# Patient Record
Sex: Male | Born: 2005
Health system: Southern US, Community
[De-identification: ages and names within clinical notes are randomized; demographics above are authoritative.]

## PROBLEM LIST (undated history)

## (undated) DIAGNOSIS — S22019A Unspecified fracture of first thoracic vertebra, initial encounter for closed fracture: Secondary | ICD-10-CM

## (undated) DIAGNOSIS — J301 Allergic rhinitis due to pollen: Secondary | ICD-10-CM

## (undated) HISTORY — DX: Allergic rhinitis due to pollen: J30.1

## (undated) HISTORY — PX: ADENOIDECTOMY: SUR15

## (undated) HISTORY — DX: Unspecified fracture of first thoracic vertebra, initial encounter for closed fracture: S22.019A

---
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
## 2023-02-06 NOTE — Progress Notes (Unsigned)
Derelle Cockrell T. Lucien Budney, MD, CAQ Sports Medicine [facility] at [facility] [address] [place], [address]  Phone: [phone]  FAX: [phone]  Tank Sayani - 17 y.o. male  MRN [id number]  Date of Birth: 08/28/2005  Date: 02/08/2023  PCP: Hannah Beat, MD  Referral: Hannah Beat, MD  No chief complaint on file.  Patient Care Team: Hannah Beat, MD as PCP - General (Family Medicine) Subjective:   Quido Bruington is a 17 y.o. pleasant patient who presents with the following:  Preventative Health Maintenance Visit:  Health Maintenance Summary Reviewed and updated, unless pt declines services.  Tobacco History Reviewed. Alcohol: No concerns, no excessive use Exercise Habits: Some activity, rec at least 30 mins 5 times a week STD concerns: no risk or activity to increase risk Drug Use: None  Gardasil booster Covid vaccine series HIV Hep C  Health Maintenance  Topic Date Due   COVID-19 Vaccine (1) Never done   HPV VACCINES (2 - Male 2-dose series) 04/11/2017   HIV Screening  Never done   Hepatitis C Screening  Never done   INFLUENZA VACCINE  09/28/2022   DTaP/Tdap/Td (7 - Td or Tdap) 08/10/2025   Immunization History  Administered Date(s) Administered   DTaP / IPV 03/16/2008   Dtap, Unspecified 05/06/2004, 07/15/2004, 09/23/2004, 06/09/2005   H1N1 03/16/2008   HIB, Unspecified 05/06/2004, 07/15/2004, 03/03/2005   HPV 9-valent 10/09/2016   Hep A, Unspecified 06/09/2005, 03/01/2006   Hep B, Unspecified 2003-07-02, 03/29/2004, 11/11/2004   Influenza, Seasonal, Injecte, Preservative Fre 01/17/2009, 03/19/2009   Influenza,inj,Quad PF,6+ Mos 01/27/2013, 12/07/2015   Influenza-Unspecified 03/03/2005, 03/16/2008, 01/27/2013   MMR 03/16/2008   MMRV 03/03/2005   Meningococcal Conjugate 10/09/2016   Meningococcal Mcv4o 11/14/2021   Pneumococcal Conjugate-13 05/06/2004, 07/15/2004, 09/23/2004, 03/03/2005   Polio, Unspecified  05/06/2004, 07/15/2004, 06/09/2005   Tdap 08/11/2015   Varicella 03/16/2008   Patient Active Problem List   Diagnosis Date Noted   On Accutane therapy 04/25/2022   Displaced fracture of shaft of right clavicle, initial encounter for closed fracture 11/15/2020   Fracture of proximal phalanx of finger 07/22/2014    Past Medical History:  Diagnosis Date   Allergic rhinitis due to pollen    Closed T1 fracture (HCC) 07/2015   T1 spinous process fracture at summer camp    Past Surgical History:  Procedure Laterality Date   ADENOIDECTOMY      Family History  Problem Relation Age of Onset   Diabetes Mother    Diabetes Paternal Grandfather    COPD Paternal Grandfather        es-smoker    Social History   Social History Narrative   Lives with Mom and Dad   His Dad had TBI, now helps with [employer]   Nephew of [place] and Nataniel Pessolano   Plays almost every sport. Baseball, Basketball, Football, Hotel manager    Past Medical History, Surgical History, Social History, Family History, Problem List, Medications, and Allergies have been reviewed and updated if relevant.  Review of Systems: Pertinent positives are listed above.  Otherwise, a full 14 point review of systems has been done in full and it is negative except where it is noted positive.  Objective:   There were no vitals taken for this visit. Ideal Body Weight:    Ideal Body Weight:   No results found.    09/01/2022   10:53 AM 06/05/2022   12:12 PM  Depression screen PHQ 2/9  Decreased Interest 0 0  Down, Depressed, Hopeless 0 0  PHQ - 2 Score 0 0  Altered sleeping 0   Tired, decreased energy 0   Change in appetite 0   Feeling bad or failure about yourself  0   Trouble concentrating 0   Moving slowly or fidgety/restless 0   Suicidal thoughts 0   PHQ-9 Score 0   Difficult doing work/chores Not difficult at all      GEN: well developed, well nourished, no acute distress Eyes: conjunctiva and lids  normal, PERRLA, EOMI ENT: TM clear, nares clear, oral exam WNL Neck: supple, no lymphadenopathy, no thyromegaly, no JVD Pulm: clear to auscultation and percussion, respiratory effort normal CV: regular rate and rhythm, S1-S2, no murmur, rub or gallop, no bruits, peripheral pulses normal and symmetric, no cyanosis, clubbing, edema or varicosities GI: soft, non-tender; no hepatosplenomegaly, masses; active bowel sounds all quadrants GU: deferred Lymph: no cervical, axillary or inguinal adenopathy MSK: gait normal, muscle tone and strength WNL, no joint swelling, effusions, discoloration, crepitus  SKIN: clear, good turgor, color WNL, no rashes, lesions, or ulcerations Neuro: normal mental status, normal strength, sensation, and motion Psych: alert; oriented to person, place and time, normally interactive and not anxious or depressed in appearance.  All labs reviewed with patient. Results for orders placed or performed in visit on 12/01/22  Alpha-Gal Panel  Result Value Ref Range   Beef <0.10 kU/L   CLASS 0    Allergen, Mutton, f88 <0.10 kU/L   Class 0    Allergen, Pork, f26 0.15 (H) kU/L   CLASS 0/1    GALACTOSE-ALPHA-1,3-GALACTOSE IGE* <0.10 <0.10 kU/L  Interpretation:  Result Value Ref Range   Interpretation      Assessment and Plan:     ICD-10-CM   1. Healthcare maintenance  Z00.00       Health Maintenance Exam: The patient's preventative maintenance and recommended screening tests for an annual wellness exam were reviewed in full today. Brought up to date unless services declined.  Counselled on the importance of diet, exercise, and its role in overall health and mortality. The patient's FH and SH was reviewed, including their home life, tobacco status, and drug and alcohol status.  Follow-up in 1 year for physical exam or additional follow-up below.  Disposition: No follow-ups on file.  No orders of the defined types were placed in this encounter.  There are no  discontinued medications. No orders of the defined types were placed in this encounter.   Signed,  Elpidio Galea. Jahnae Mcadoo, MD   Allergies as of 02/08/2023       Reactions   Amoxicillin Rash        Medication List        Accurate as of February 06, 2023  6:19 PM. If you have any questions, ask your nurse or doctor.          acetaminophen 500 MG tablet Commonly known as: TYLENOL Take 250 mg by mouth every 6 (six) hours as needed.   Claravis 40 MG capsule Generic drug: ISOtretinoin Take 40 mg by mouth 2 (two) times daily.   clindamycin 300 MG capsule Commonly known as: Cleocin Take 1 capsule (300 mg total) by mouth 3 (three) times daily.   diphenhydrAMINE 25 MG tablet Commonly known as: BENADRYL Take 25 mg by mouth at bedtime as needed for allergies.   EPINEPHrine 0.3 mg/0.3 mL Soaj injection Commonly known as: EPI-PEN Inject 0.3 mg into the muscle as needed for anaphylaxis.

## 2023-02-08 ENCOUNTER — Encounter: Payer: Self-pay | Admitting: Family Medicine

## 2023-02-08 ENCOUNTER — Encounter: Payer: Commercial Managed Care - PPO | Admitting: Family Medicine

## 2023-02-08 ENCOUNTER — Ambulatory Visit (INDEPENDENT_AMBULATORY_CARE_PROVIDER_SITE_OTHER): Payer: Commercial Managed Care - PPO | Admitting: Family Medicine

## 2023-02-08 VITALS — BP 110/80 | HR 59 | Temp 98.0°F | Ht 75.25 in | Wt 204.2 lb

## 2023-02-08 DIAGNOSIS — R5383 Other fatigue: Secondary | ICD-10-CM

## 2023-02-08 DIAGNOSIS — Z0000 Encounter for general adult medical examination without abnormal findings: Secondary | ICD-10-CM | POA: Diagnosis not present

## 2023-02-08 DIAGNOSIS — Z131 Encounter for screening for diabetes mellitus: Secondary | ICD-10-CM

## 2023-02-08 DIAGNOSIS — Z13220 Encounter for screening for lipoid disorders: Secondary | ICD-10-CM

## 2023-02-08 LAB — HEMOGLOBIN A1C: Hgb A1c MFr Bld: 5.6 % (ref 4.6–6.5)

## 2023-02-08 LAB — HEPATIC FUNCTION PANEL
ALT: 13 U/L (ref 0–53)
AST: 22 U/L (ref 0–37)
Albumin: 4.8 g/dL (ref 3.5–5.2)
Alkaline Phosphatase: 69 U/L (ref 52–171)
Bilirubin, Direct: 0.2 mg/dL (ref 0.0–0.3)
Total Bilirubin: 1 mg/dL (ref 0.3–1.2)
Total Protein: 7.2 g/dL (ref 6.0–8.3)

## 2023-02-08 LAB — CBC WITH DIFFERENTIAL/PLATELET
Basophils Absolute: 0.1 10*3/uL (ref 0.0–0.1)
Basophils Relative: 1.4 % (ref 0.0–3.0)
Eosinophils Absolute: 0.5 10*3/uL (ref 0.0–0.7)
Eosinophils Relative: 11.3 % — ABNORMAL HIGH (ref 0.0–5.0)
HCT: 43.4 % (ref 36.0–49.0)
Hemoglobin: 15.1 g/dL (ref 12.0–16.0)
Lymphocytes Relative: 45.8 % (ref 24.0–48.0)
Lymphs Abs: 2.1 10*3/uL (ref 0.7–4.0)
MCHC: 34.8 g/dL (ref 31.0–37.0)
MCV: 92.5 fL (ref 78.0–98.0)
Monocytes Absolute: 0.4 10*3/uL (ref 0.1–1.0)
Monocytes Relative: 8 % (ref 3.0–12.0)
Neutro Abs: 1.5 10*3/uL (ref 1.4–7.7)
Neutrophils Relative %: 33.5 % — ABNORMAL LOW (ref 43.0–71.0)
Platelets: 195 10*3/uL (ref 150.0–575.0)
RBC: 4.69 Mil/uL (ref 3.80–5.70)
RDW: 12.1 % (ref 11.4–15.5)
WBC: 4.6 10*3/uL (ref 4.5–13.5)

## 2023-02-08 LAB — LIPID PANEL
Cholesterol: 119 mg/dL (ref 0–200)
HDL: 50.6 mg/dL (ref >39.00)
LDL Cholesterol: 58 mg/dL (ref 0–99)
NonHDL: 68.73
Total CHOL/HDL Ratio: 2
Triglycerides: 52 mg/dL (ref 0.0–149.0)
VLDL: 10.4 mg/dL (ref 0.0–40.0)

## 2023-02-08 LAB — BASIC METABOLIC PANEL
BUN: 14 mg/dL (ref 6–23)
CO2: 30 meq/L (ref 19–32)
Calcium: 9.7 mg/dL (ref 8.4–10.5)
Chloride: 103 meq/L (ref 96–112)
Creatinine, Ser: 1.02 mg/dL (ref 0.40–1.50)
GFR: 106.96 mL/min (ref >60.00)
Glucose, Bld: 92 mg/dL (ref 70–99)
Potassium: 4.2 meq/L (ref 3.5–5.1)
Sodium: 140 meq/L (ref 135–145)

## 2023-02-16 ENCOUNTER — Encounter: Payer: Self-pay | Admitting: *Deleted

## 2023-07-04 ENCOUNTER — Encounter (HOSPITAL_COMMUNITY): Payer: Self-pay

## 2023-07-11 ENCOUNTER — Ambulatory Visit: Admitting: Family Medicine

## 2023-07-11 ENCOUNTER — Encounter: Payer: Self-pay | Admitting: Family Medicine

## 2023-07-11 VITALS — BP 110/80 | HR 63 | Temp 98.0°F | Wt 196.1 lb

## 2023-07-11 DIAGNOSIS — Z0000 Encounter for general adult medical examination without abnormal findings: Secondary | ICD-10-CM | POA: Diagnosis not present

## 2023-07-11 NOTE — Progress Notes (Signed)
 Breea Loncar T. Thelton Graca, MD, CAQ Sports Medicine [facility] [address] [place], [address]  Phone: [phone]  FAX: [phone]  Momar Sicurella - 18 y.o. male  MRN [id number]  Date of Birth: 03/24/2005  Date: 07/11/2023  PCP: Scherrie Curt, MD  Referral: Scherrie Curt, MD  Chief Complaint  Patient presents with   Neomia Banner   Patient Care Team: Scherrie Curt, MD as PCP - General (Family Medicine) Subjective:   Margo Naula is a 18 y.o. pleasant patient who presents with the following:  Preventative Health Maintenance Visit:  Health Maintenance Summary Reviewed and updated, unless pt declines services.  Tobacco History Reviewed. Alcohol: No concerns, no excessive use - none Exercise Habits: Some activity, rec at least 30 mins 5 times a week -running and working out every day for baseball STD concerns: no risk or activity to increase risk Drug Use: None  [place] - playing baseball - outfield Is going to trying to transfer  Twila Gale - goes to [employer] Not sexually active  Covid Meningitis - Bexsero Gardasil #2 HIV Hep C  Wt Readings from Last 3 Encounters:  07/11/23 196 lb 2 oz (89 kg) (91%, Z= 1.35)*  02/08/23 204 lb 4 oz (92.6 kg) (94%, Z= 1.58)*  09/01/22 189 lb 2 oz (85.8 kg) (90%, Z= 1.26)*   * Growth percentiles are based on CDC (Boys, 2-20 Years) data.     Health Maintenance  Topic Date Due   COVID-19 Vaccine (1) Never done   HIV Screening  Never done   Meningococcal B Vaccine (1 of 2 - Standard) Never done   Hepatitis C Screening  Never done   HPV VACCINES (2 - Male 2-dose series) 02/08/2024 (Originally 04/11/2017)   INFLUENZA VACCINE  09/28/2023   DTaP/Tdap/Td (7 - Td or Tdap) 08/10/2025   Pneumococcal Vaccine 40-9 Years old  Completed   Immunization History  Administered Date(s) Administered   DTaP / IPV 03/16/2008   Dtap, Unspecified 05/06/2004, 07/15/2004, 09/23/2004,  06/09/2005   H1N1 03/16/2008   HIB, Unspecified 05/06/2004, 07/15/2004, 03/03/2005   HPV 9-valent 10/09/2016   Hep A, Unspecified 06/09/2005, 03/01/2006   Hep B, Unspecified Jun 09, 2003, 03/29/2004, 11/11/2004   Influenza, Seasonal, Injecte, Preservative Fre 01/17/2009, 03/19/2009   Influenza,inj,Quad PF,6+ Mos 01/27/2013, 12/07/2015   Influenza-Unspecified 03/03/2005, 03/16/2008, 01/27/2013   MMR 03/16/2008   MMRV 03/03/2005   Meningococcal Conjugate 10/09/2016   Meningococcal Mcv4o 11/14/2021   Pneumococcal Conjugate-13 05/06/2004, 07/15/2004, 09/23/2004, 03/03/2005   Polio, Unspecified 05/06/2004, 07/15/2004, 06/09/2005   Tdap 08/11/2015   Varicella 03/16/2008   Patient Active Problem List   Diagnosis Date Noted   Allergic reaction to alpha-gal 10/29/2022   On Accutane therapy 04/25/2022    Past Medical History:  Diagnosis Date   Allergic rhinitis due to pollen    Closed T1 fracture (HCC) 07/2015   T1 spinous process fracture at summer camp    Past Surgical History:  Procedure Laterality Date   ADENOIDECTOMY      Family History  Problem Relation Age of Onset   Diabetes Mother    Diabetes Paternal Grandfather    COPD Paternal Grandfather        es-smoker    Social History   Social History Narrative   Lives with Mom and Dad   His Dad had TBI, now helps with [employer]   Nephew of [place] and Brayland Nelles   Plays 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  Review of Systems: Pertinent positives are listed above.  Otherwise, a full 14 point review of systems has been done in full and it is negative except where it is noted positive.  Objective:   BP 110/80   Pulse 63   Temp 98 F (36.7 C) (Temporal)   Wt 196 lb 2 oz (89 kg)   SpO2 98%   BMI 24.35 kg/m  Ideal Body  Weight:    Ideal Body Weight:   Hearing Screening   500Hz  1000Hz  2000Hz  4000Hz   Right ear 20 20 20 20   Left ear 20 20 20 20       09/01/2022   10:53 AM 06/05/2022   12:12 PM  Depression screen PHQ 2/9  Decreased Interest 0 0  Down, Depressed, Hopeless 0 0  PHQ - 2 Score 0 0  Altered sleeping 0   Tired, decreased energy 0   Change in appetite 0   Feeling bad or failure about yourself  0   Trouble concentrating 0   Moving slowly or fidgety/restless 0   Suicidal thoughts 0   PHQ-9 Score 0   Difficult doing work/chores Not difficult at all      GEN: well developed, well nourished, no acute distress Eyes: conjunctiva and lids normal, PERRLA, EOMI ENT: TM clear, nares clear, oral exam WNL Neck: supple, no lymphadenopathy, no thyromegaly, no JVD Pulm: clear to auscultation and percussion, respiratory effort normal CV: regular rate and rhythm, S1-S2, no murmur, rub or gallop, no bruits, peripheral pulses normal and symmetric, no cyanosis, clubbing, edema or varicosities GI: soft, non-tender; no hepatosplenomegaly, masses; active bowel sounds all quadrants GU: deferred Lymph: no cervical, axillary or inguinal adenopathy MSK: gait normal, muscle tone and strength WNL, no joint swelling, effusions, discoloration, crepitus  Cervical, shoulder, elbow, hand and wrist, hip, knee, foot and ankle exams are all normal. SKIN: clear, good turgor, color WNL, no rashes, lesions, or ulcerations Neuro: normal mental status, normal strength, sensation, and motion Psych: alert; oriented to person, place and time, normally interactive and not anxious or depressed in appearance.  All labs reviewed with patient. Results for orders placed or performed in visit on 02/08/23  Basic metabolic panel   Collection Time: 02/08/23  9:26 AM  Result Value Ref Range   Sodium 140 135 - 145 mEq/L   Potassium 4.2 3.5 - 5.1 mEq/L   Chloride 103 96 - 112 mEq/L   CO2 30 19 - 32 mEq/L   Glucose, Bld 92 70 - 99 mg/dL    BUN 14 6 - 23 mg/dL   Creatinine, Ser 1.61 0.40 - 1.50 mg/dL   GFR 096.04 >54.09 mL/min   Calcium 9.7 8.4 - 10.5 mg/dL  CBC with Differential/Platelet   Collection Time: 02/08/23  9:26 AM  Result Value Ref Range   WBC 4.6 4.5 - 13.5 K/uL   RBC 4.69 3.80 - 5.70 Mil/uL   Hemoglobin 15.1 12.0 - 16.0 g/dL   HCT 81.1 91.4 - 78.2 %   MCV 92.5 78.0 - 98.0 fl   MCHC 34.8 31.0 - 37.0 g/dL   RDW 95.6 21.3 - 08.6 %   Platelets 195.0 150.0 - 575.0 K/uL   Neutrophils Relative % 33.5 (L) 43.0 - 71.0 %   Lymphocytes Relative 45.8 24.0 - 48.0 %   Monocytes Relative 8.0 3.0 - 12.0 %   Eosinophils Relative 11.3 (H) 0.0 -  5.0 %   Basophils Relative 1.4 0.0 - 3.0 %   Neutro Abs 1.5 1.4 - 7.7 K/uL   Lymphs Abs 2.1 0.7 - 4.0 K/uL   Monocytes Absolute 0.4 0.1 - 1.0 K/uL   Eosinophils Absolute 0.5 0.0 - 0.7 K/uL   Basophils Absolute 0.1 0.0 - 0.1 K/uL  Hepatic function panel   Collection Time: 02/08/23  9:26 AM  Result Value Ref Range   Total Bilirubin 1.0 0.3 - 1.2 mg/dL   Bilirubin, Direct 0.2 0.0 - 0.3 mg/dL   Alkaline Phosphatase 69 52 - 171 U/L   AST 22 0 - 37 U/L   ALT 13 0 - 53 U/L   Total Protein 7.2 6.0 - 8.3 g/dL   Albumin 4.8 3.5 - 5.2 g/dL  Hemoglobin A5W   Collection Time: 02/08/23  9:26 AM  Result Value Ref Range   Hgb A1c MFr Bld 5.6 4.6 - 6.5 %  Lipid panel   Collection Time: 02/08/23  9:26 AM  Result Value Ref Range   Cholesterol 119 0 - 200 mg/dL   Triglycerides 09.8 0.0 - 149.0 mg/dL   HDL 11.91 >47.82 mg/dL   VLDL 95.6 0.0 - 21.3 mg/dL   LDL Cholesterol 58 0 - 99 mg/dL   Total CHOL/HDL Ratio 2    NonHDL 68.73     Assessment and Plan:     ICD-10-CM   1. Healthcare maintenance  Z00.00      Think he is doing perfectly well No limitations from a physical standpoint for returning to school or playing intercollegiate baseball.  Did recommend that he consider getting Gardasil and Bexsero.  He is going to discuss this with his mom.  All of his prior labs were  reviewed again with him in the office today, and they are normal.  Health Maintenance Exam: The patient's preventative maintenance and recommended screening tests for an annual wellness exam were reviewed in full today. Brought up to date unless services declined.  Counselled on the importance of diet, exercise, and its role in overall health and mortality. The patient's FH and SH was reviewed, including their home life, tobacco status, and drug and alcohol status.  Follow-up in 1 year for physical exam or additional follow-up below.  Disposition: No follow-ups on file.  No orders of the defined types were placed in this encounter.  There are no discontinued medications. No orders of the defined types were placed in this encounter.   Signed,  Ranny Bye. Ryott Rafferty, MD   Allergies as of 07/11/2023       Reactions   Amoxicillin Rash        Medication List        Accurate as of Jul 11, 2023  2:09 PM. If you have any questions, ask your nurse or doctor.          acetaminophen  500 MG tablet Commonly known as: TYLENOL  Take 250 mg by mouth every 6 (six) hours as needed.   diphenhydrAMINE 25 MG tablet Commonly known as: BENADRYL Take 25 mg by mouth at bedtime as needed for allergies.   EPINEPHrine  0.3 mg/0.3 mL Soaj injection Commonly known as: EPI-PEN Inject 0.3 mg into the muscle as needed for anaphylaxis.

## 2023-07-11 NOTE — Patient Instructions (Signed)
 Meningitis vaccine - Bexsero meningitis booster  Gardasil vaccine #2, got the initial one 2019.  This prevents HPV driven cervical, anal, and oral cancers along with genital warts

## 2023-10-08 DIAGNOSIS — H5201 Hypermetropia, right eye: Secondary | ICD-10-CM | POA: Diagnosis not present

## 2023-10-10 ENCOUNTER — Telehealth: Payer: Self-pay | Admitting: Family Medicine

## 2023-10-10 NOTE — Telephone Encounter (Signed)
 Left message for Travis Ayers that we do not have a copy of a physical form from Warner's last visit.  I let her know we are happy to fill out a physical form but we would need them to provide the form because all I have is for high school.

## 2023-10-10 NOTE — Telephone Encounter (Signed)
 Copied from CRM #[id number]. Topic: General - Call Back - No Documentation >> Oct 10, 2023 11:51 AM Rea C wrote: Reason for CRM: Patient's mom called in to see if she can receive a copy of the sports physical form that Dr. Watt filled out from patients last physical. Patient needs it for school. If it can be uploaded to MyChart or picked up is fine.   [phone] (Patients mom)

## 2023-10-11 NOTE — Telephone Encounter (Signed)
 April (mom) faxed over sports physical form.  Form placed in Dr. Eleanore office in box to complete.

## 2023-11-15 ENCOUNTER — Ambulatory Visit

## 2023-11-22 DIAGNOSIS — M24812 Other specific joint derangements of left shoulder, not elsewhere classified: Secondary | ICD-10-CM | POA: Diagnosis not present
